# Patient Record
Sex: Female | Born: 1985 | Hispanic: Yes
Health system: Midwestern US, Community
[De-identification: ages and names within clinical notes are randomized; demographics above are authoritative.]

## PROBLEM LIST (undated history)

## (undated) DIAGNOSIS — I10 Essential (primary) hypertension: Secondary | ICD-10-CM

---

## 2019-09-06 IMAGING — CR C-SPINE 4 or 5 views
1 series · 5 of 5 positions shown · non-contrast
Comparison: None.

HISTORY: 31-year-old female with nontoxic goiter, unspecified, localized swelling, mass and lump, neck    .
TECHNIQUE: 5 views of cervical spine.

[Series 1: lat · 0.17mm/px · 5 of 5 slices shown]
[im 1/5]
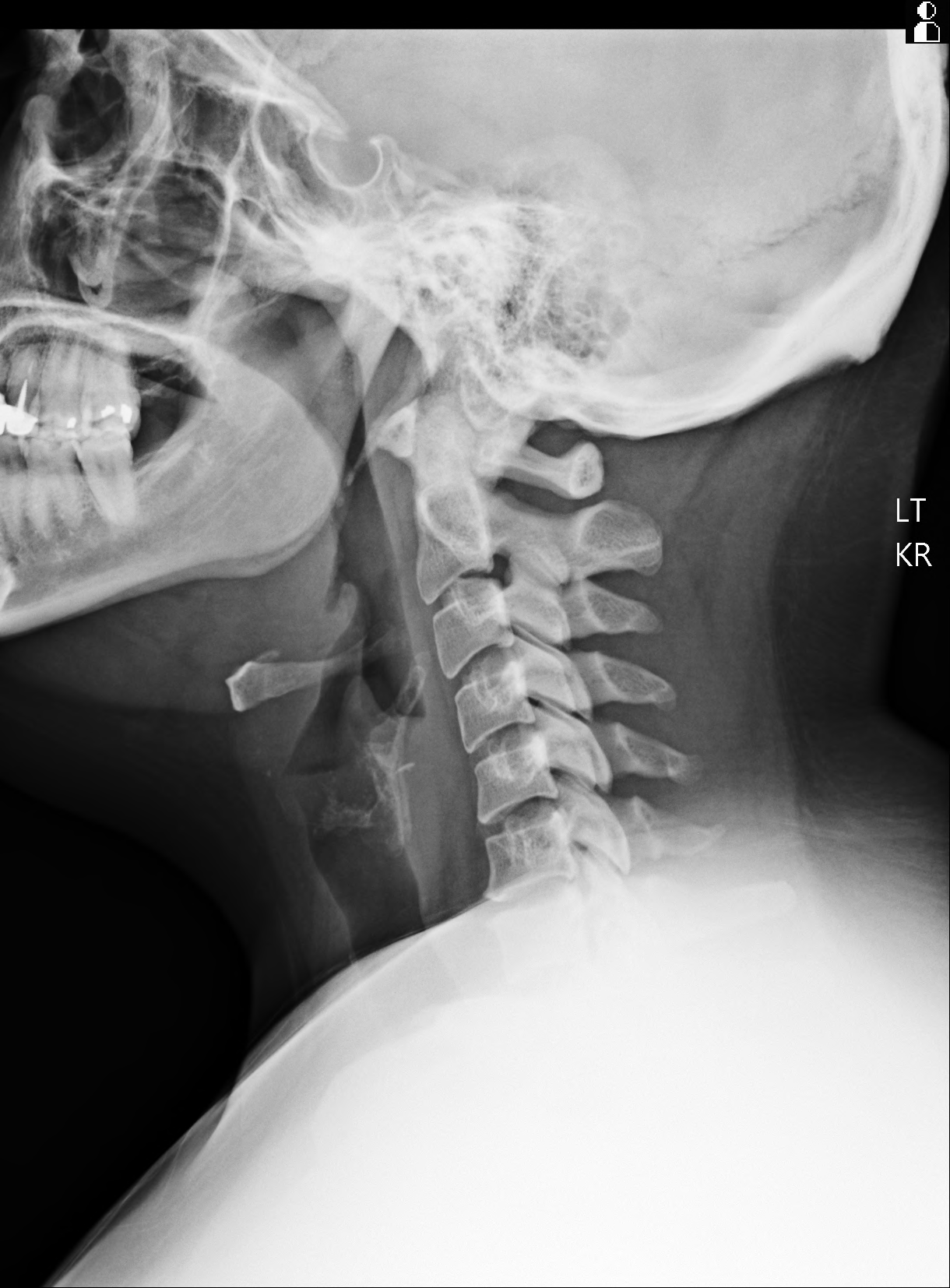
[im 2/5]
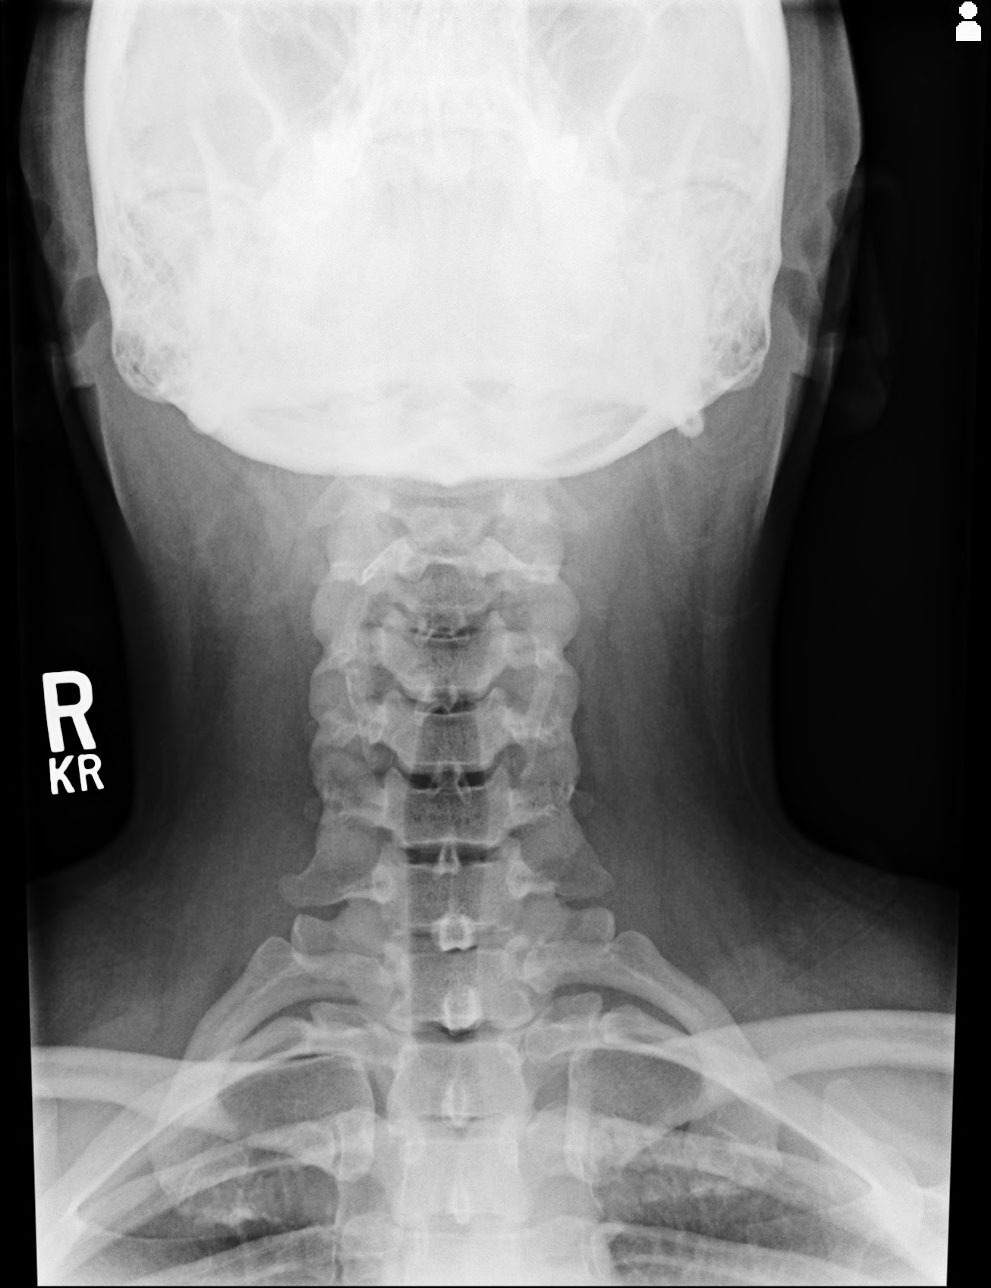
[im 3/5]
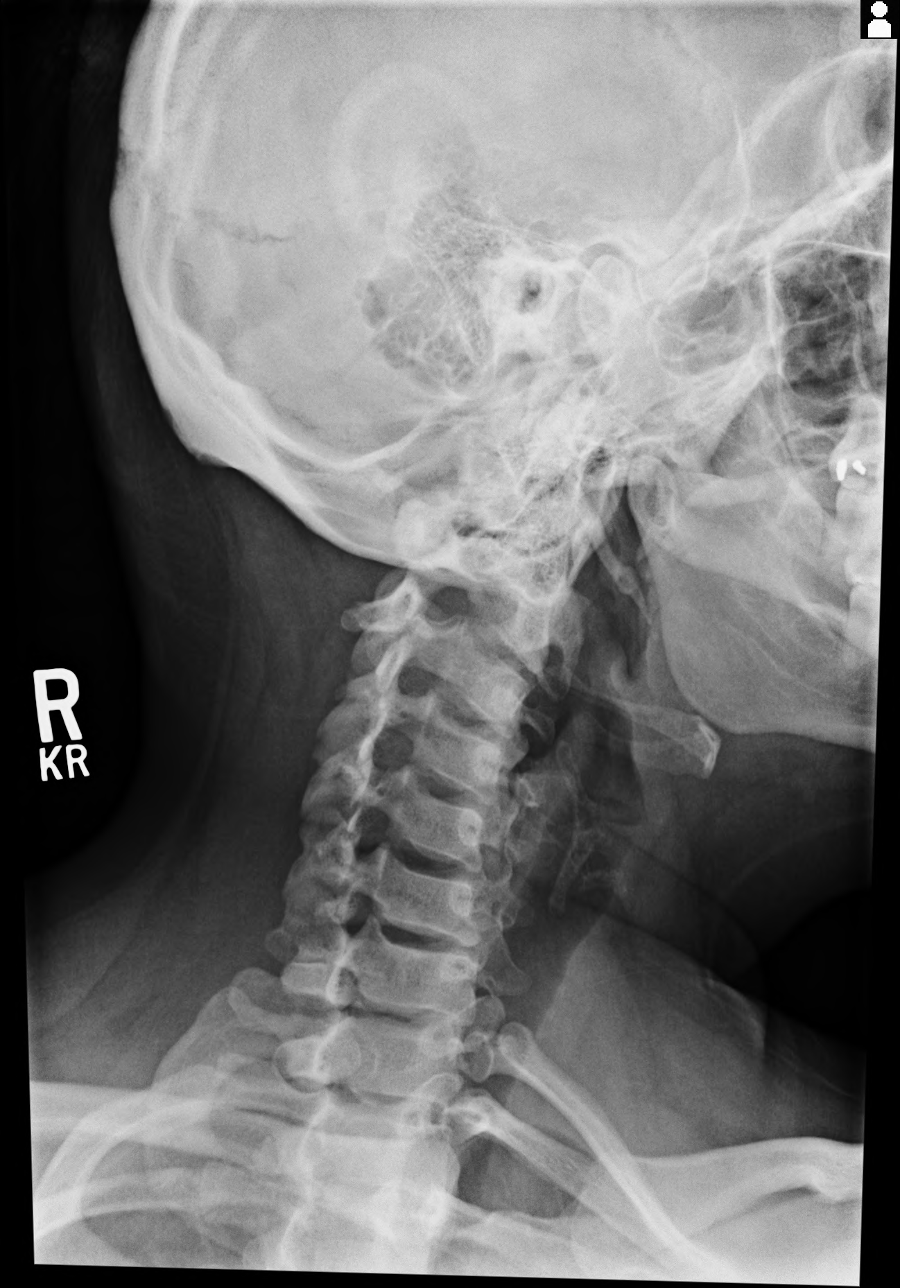
[im 4/5]
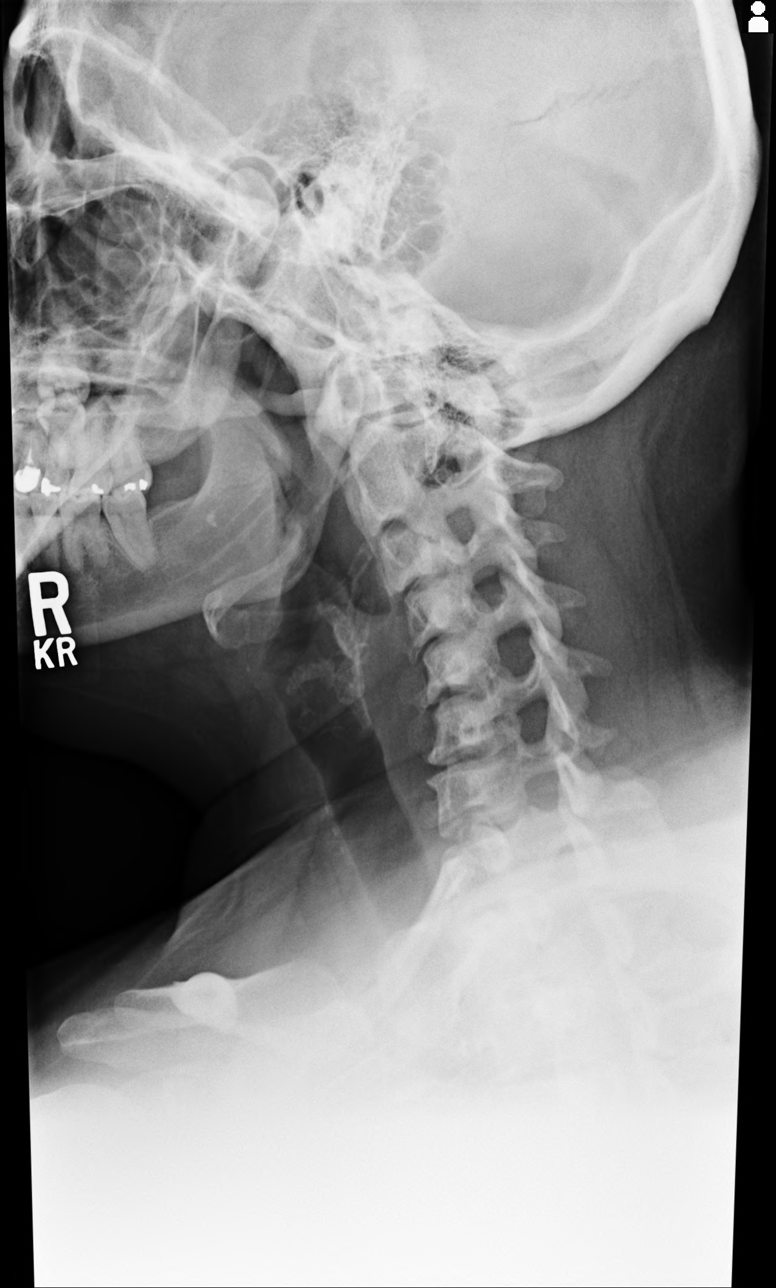
[im 5/5]
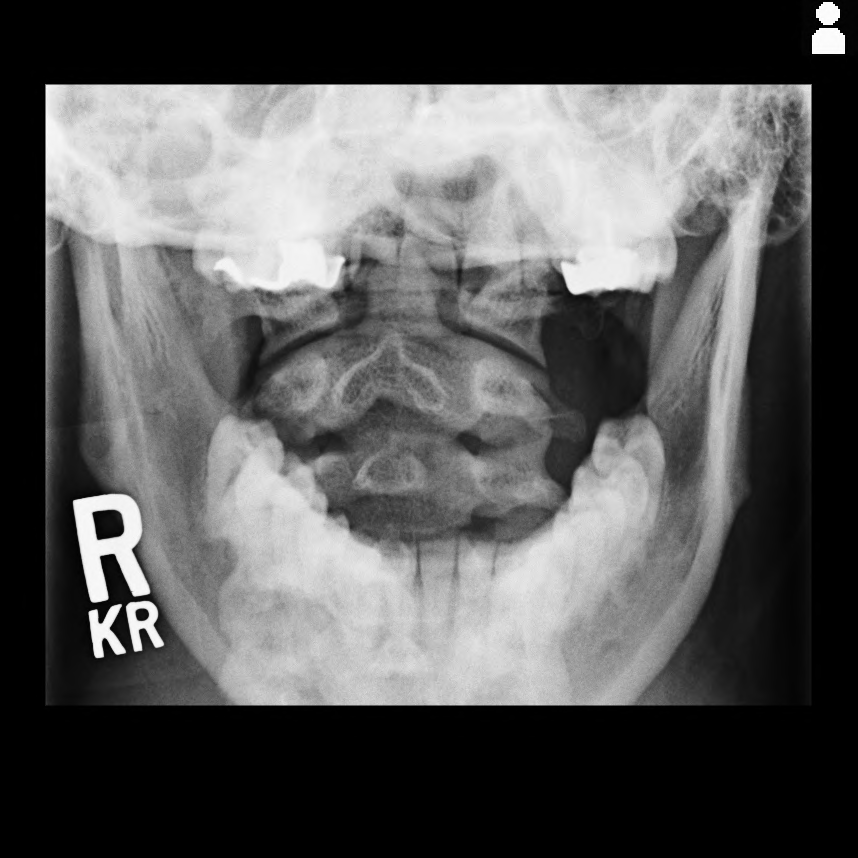

[5 of 5 positions shown; findings below may reference images not displayed]

FINDINGS: Vertebral body height and alignment: The vertebral body height and alignment are unremarkable.

Degenerative disc changes: No significant degenerative changes are present.

Soft tissue: The soft tissue is unremarkable.
IMPRESSION: The vertebral body height and alignment are within normal limits.

## 2019-09-11 IMAGING — US US SOFT TISSUE NECK/THYROID
1 series · 14 of 25 positions shown · non-contrast
Comparison: None.

HISTORY: 31-year-old female, swelling, mass and lump.
TECHNIQUE: Thyroid/neck ultrasound.

[Series 1: us soft tissue neck/thyroid · 14 of 42 slices shown]
[im 1/42]
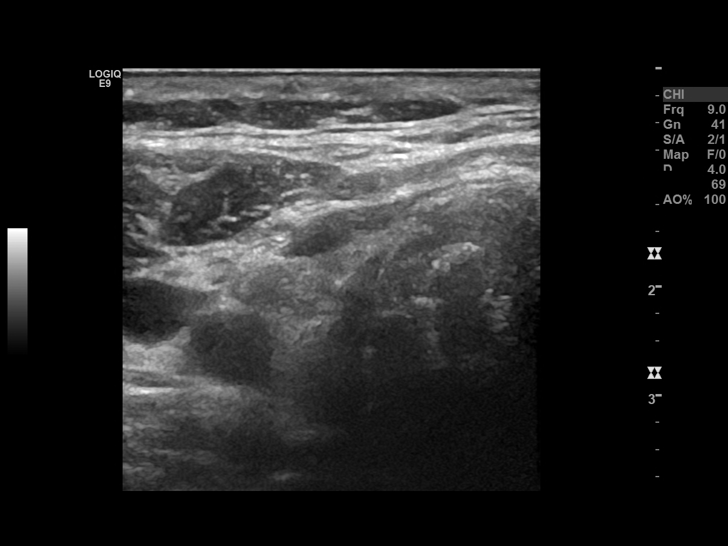
[im 4/42]
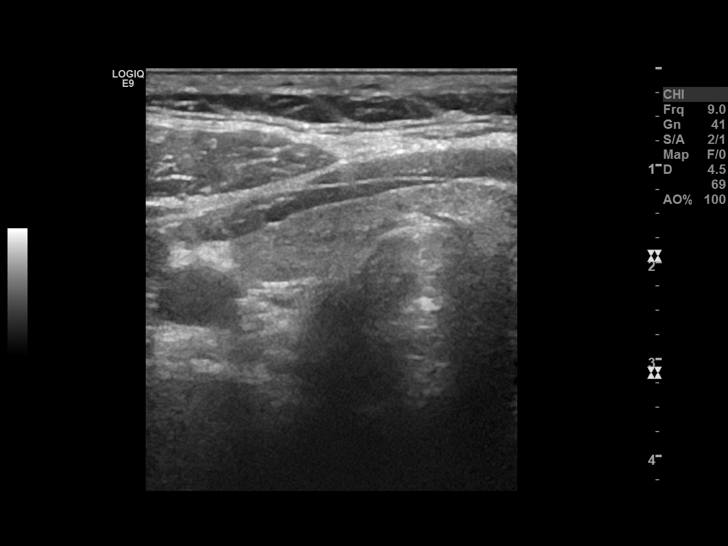
[im 7/42]
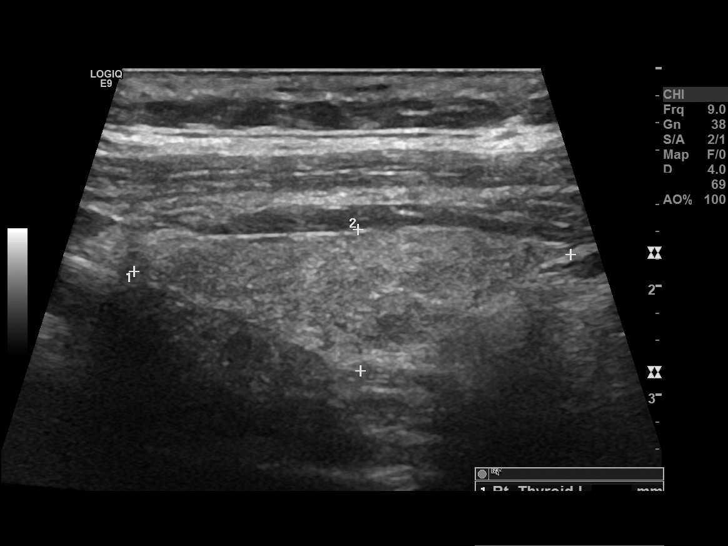
[im 11/42]
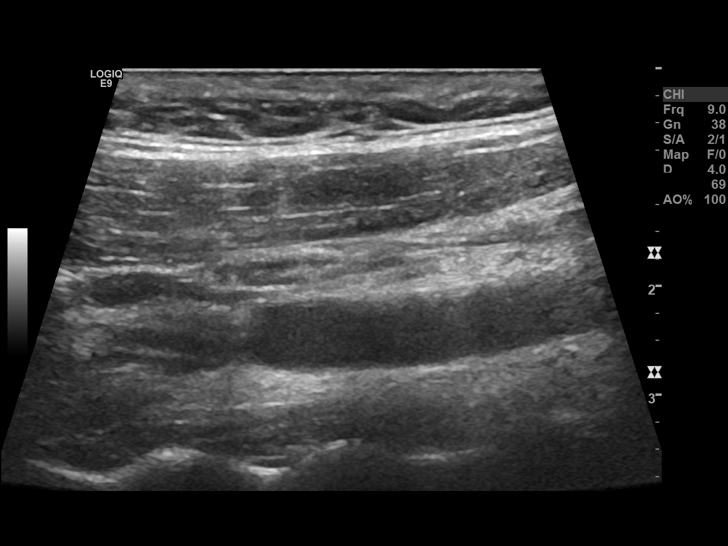
[im 14/42]
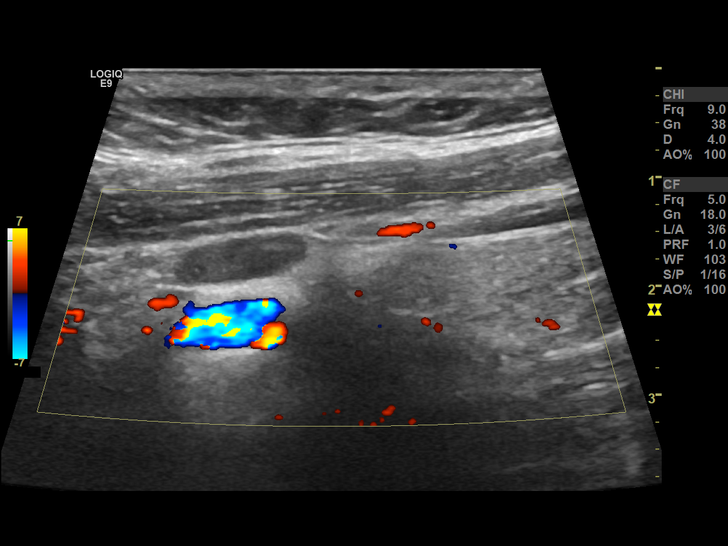
[im 16/42]
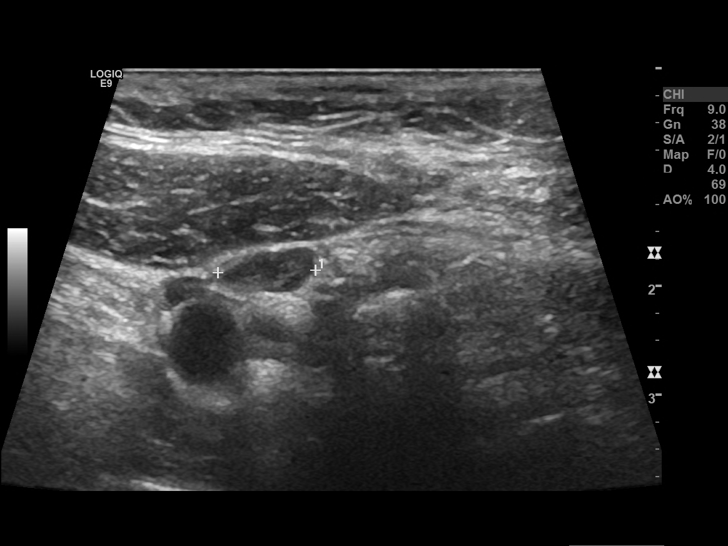
[im 19/42]
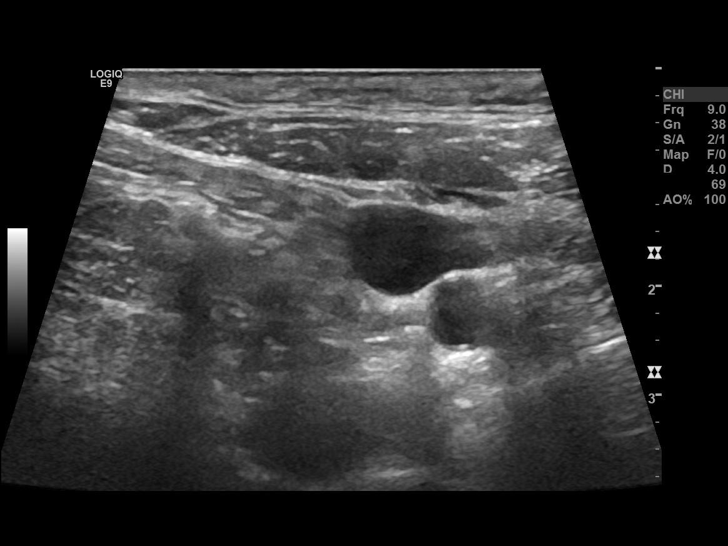
[im 23/42]
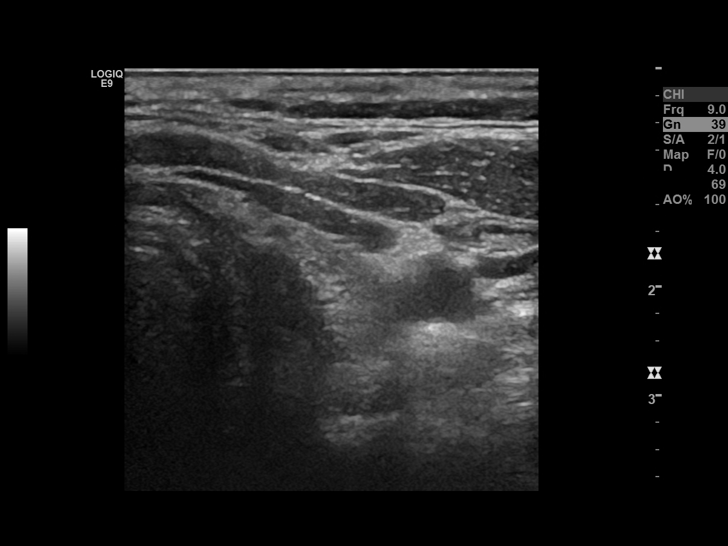
[im 26/42]
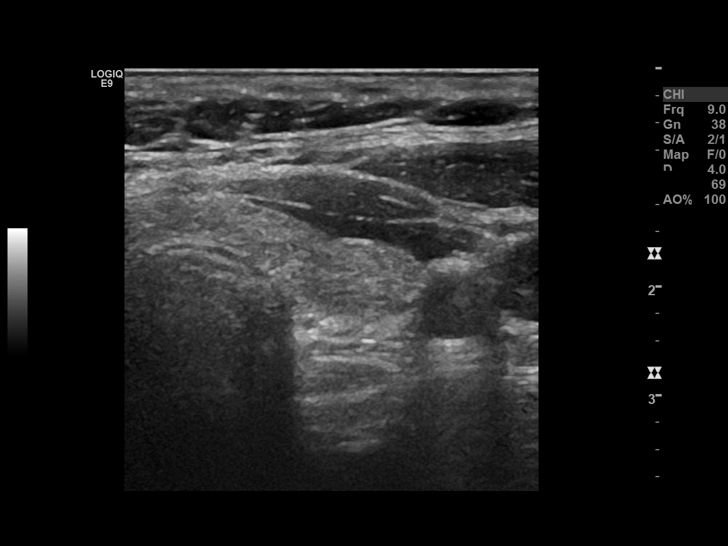
[im 28/42]
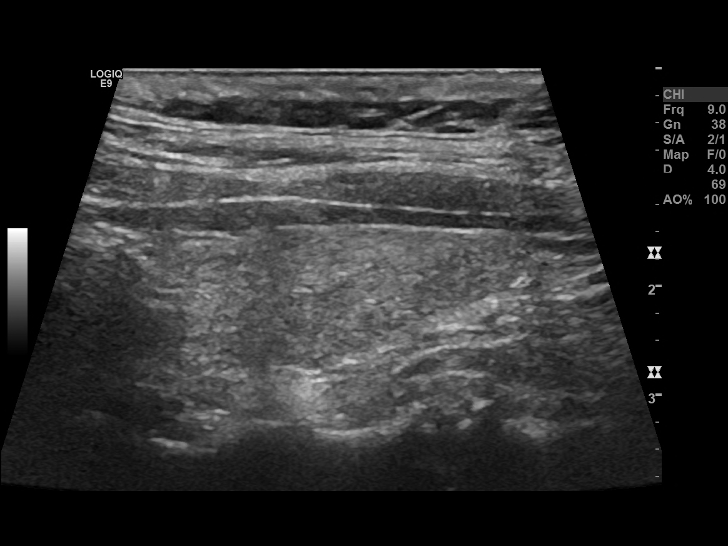
[im 31/42]
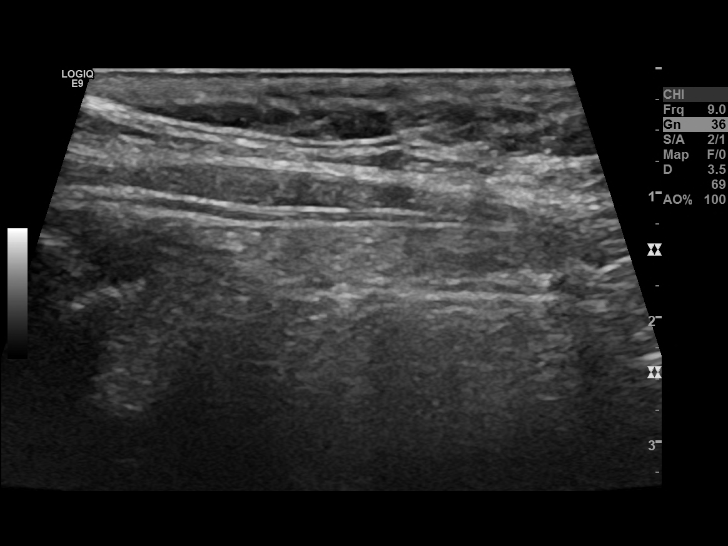
[im 35/42]
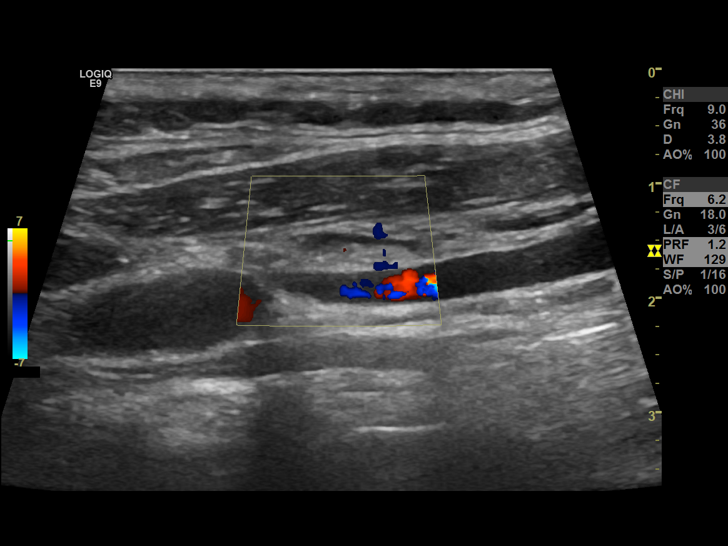
[im 38/42]
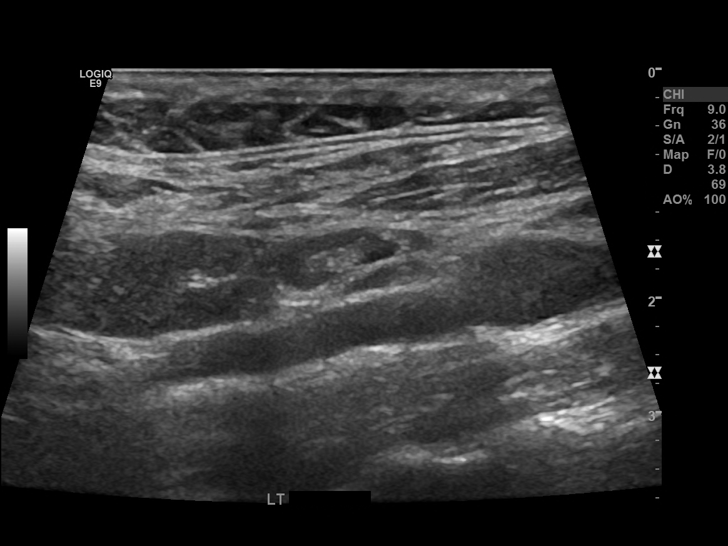
[im 42/42]
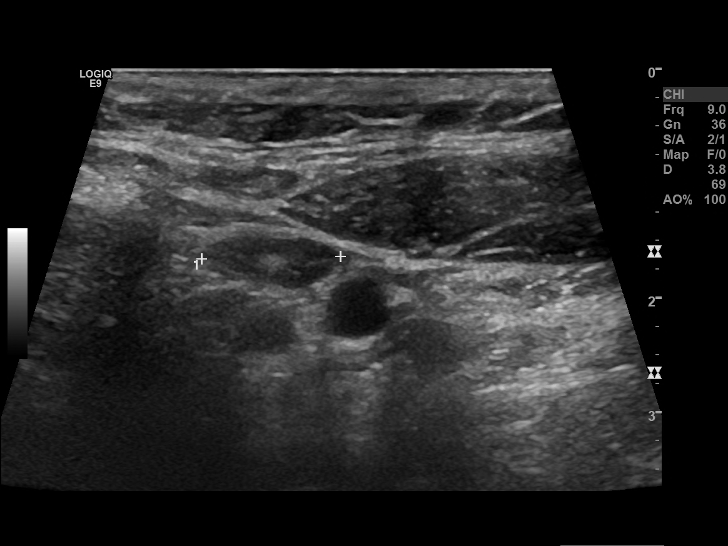

[14 of 25 positions shown; findings below may reference images not displayed]

FINDINGS: Thyroid size:

Right lobe: 40 x 13 x 14 mm.

Isthmus: 3 mm.

Left lobe: 39 x 14 x 16 mm.

Thyroid nodules:

Right lobe nodules:

1. None.

Left lobe nodules:

1. None.

NECK ULTRASOUND: Zone III on the right shows a lymph node 12 x 5 x 9 mm.

Left neck shows two lymph nodes, one zone III of 10 x 2 x 6 mm and a second in zone VI of 14 x 5 x 12 mm. These lymph nodes have fatty hilus, not increased vasculature and short axis is less than 10 mm; therefore, not pathologically large.
IMPRESSION: 1. Lymph nodes in neck described not pathologically large.

2. No thyroid mass or enlargement.

3. For persistent neck discomfort, pain or findings clinically, recommend followup CT of neck.

## 2019-09-27 IMAGING — CT CT NECK SOFT TISSUE W CONTRAST
3 of 4 series · 14 of 33 positions shown, 17 images · IV contrast (isovue)
Comparison: Ultrasound of neck 02/08/2018.

HISTORY: 31-year-old female with localized swelling, mass or lump in neck.
TECHNIQUE: Multiple helical CT images of the neck were acquired after the administration of IV contrast.  Multiplanar reformatted images were also generated.

CONTRAST: 80 mL Isovue 300 IV contrast.

[Series 3: soft tissue w · axial · 0.48mm/px · z∈[-284,-116]mm · 6 of 106 slices shown, 8 images]
[im 11/106  soft-tissue]
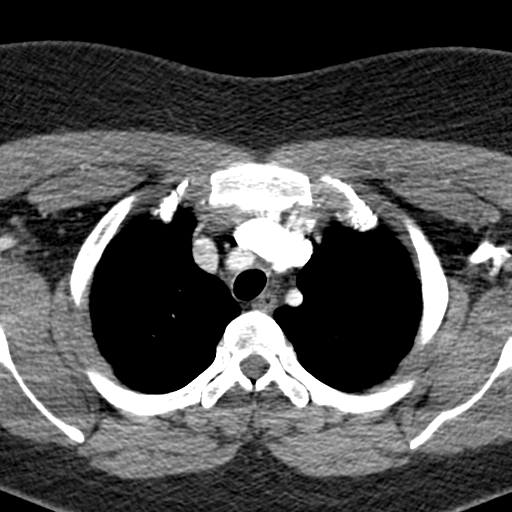
[im 11/106  bone]
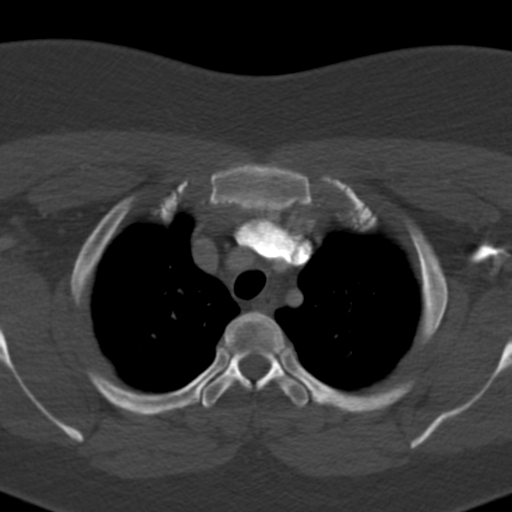
[im 32/106  bone]
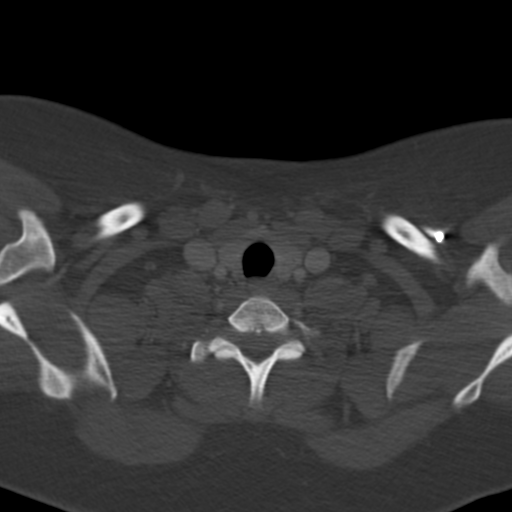
[im 43/106  bone]
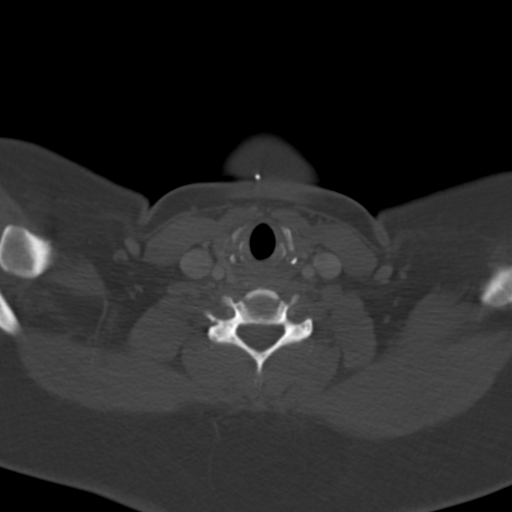
[im 64/106  bone]
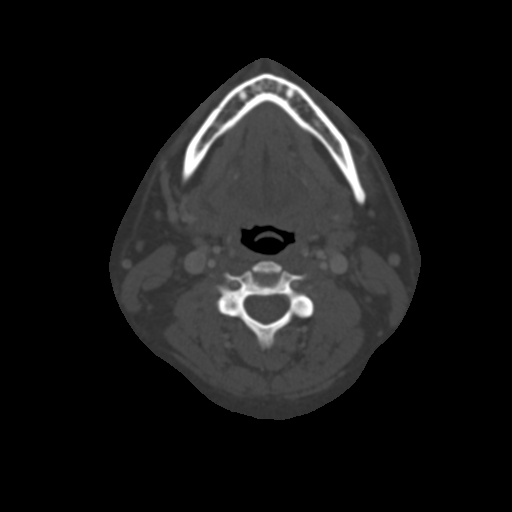
[im 74/106  soft-tissue]
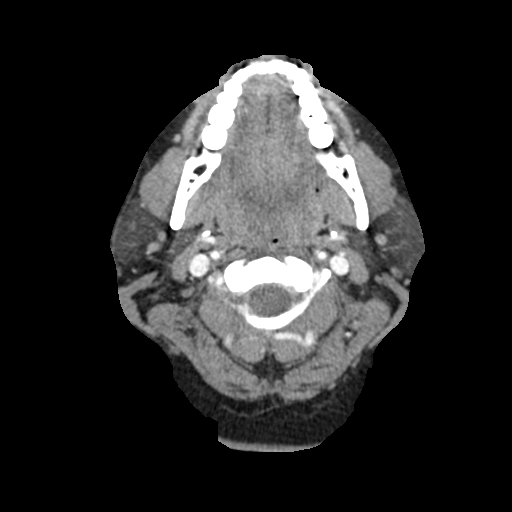
[im 74/106  bone]
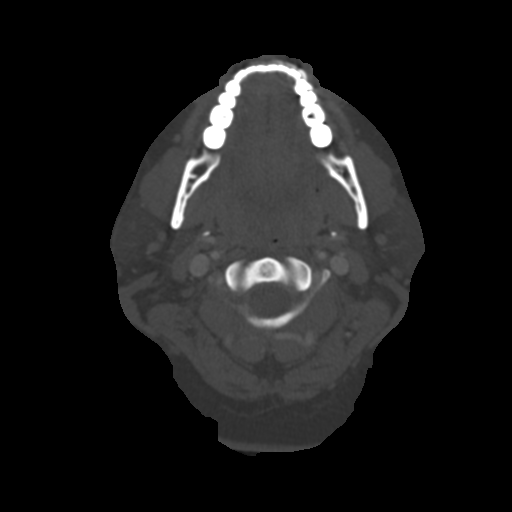
[im 95/106  bone]
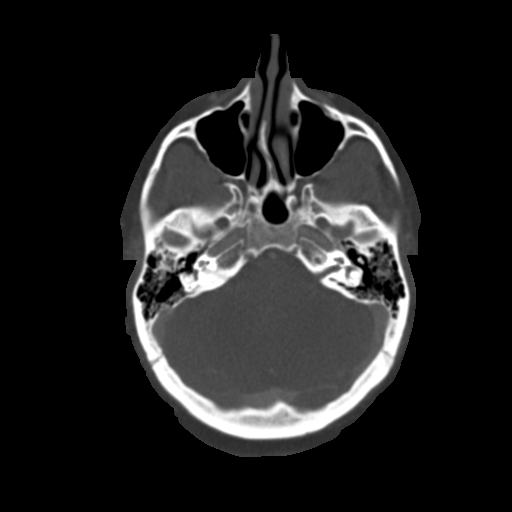

[Series 5: cor · coronal · 0.41mm/px · 3 of 106 slices shown]
[im 22/106  bone]
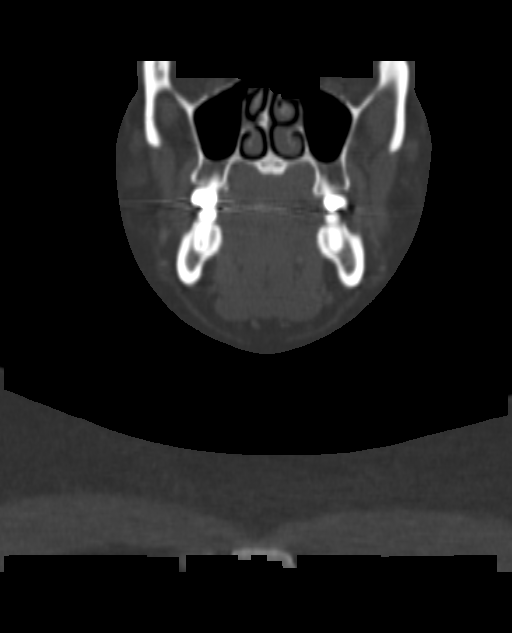
[im 43/106  bone]
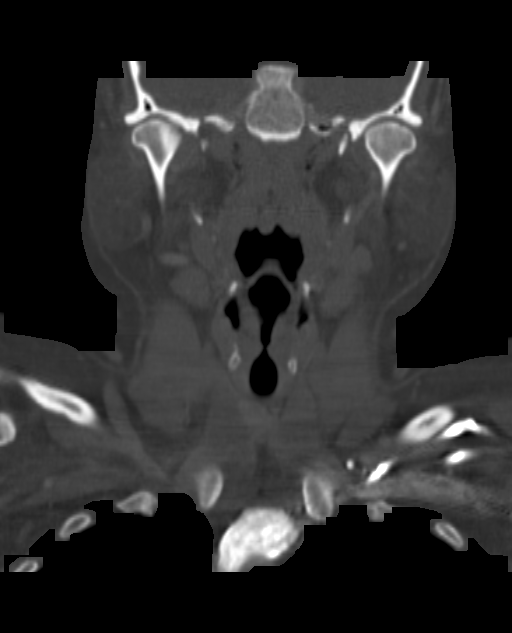
[im 64/106  bone]
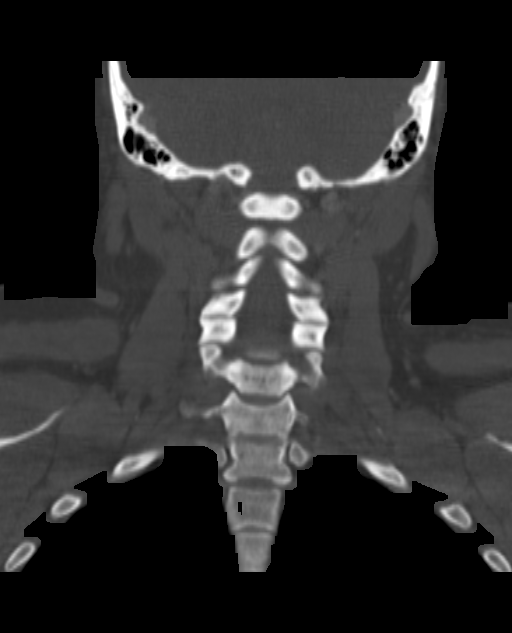

[Series 6: sag · sagittal · 0.41mm/px · 5 of 100 slices shown, 6 images]
[im 34/100  bone]
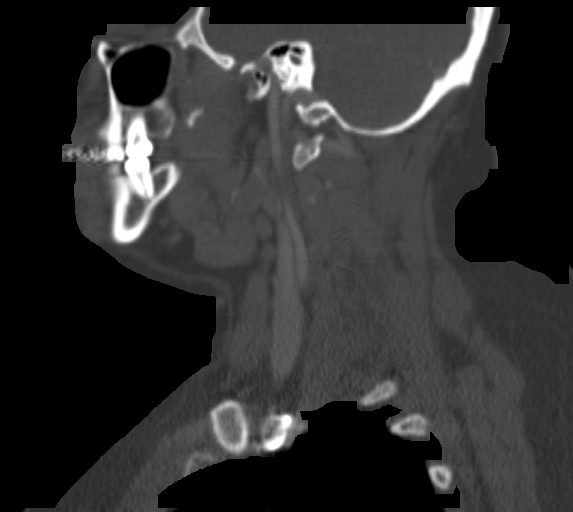
[im 42/100  bone]
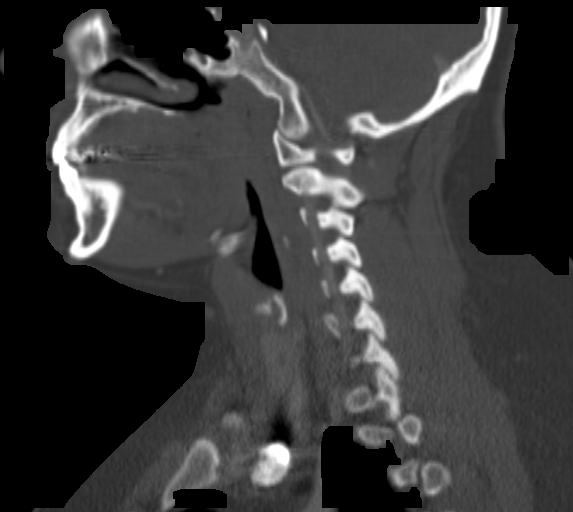
[im 50/100  soft-tissue]
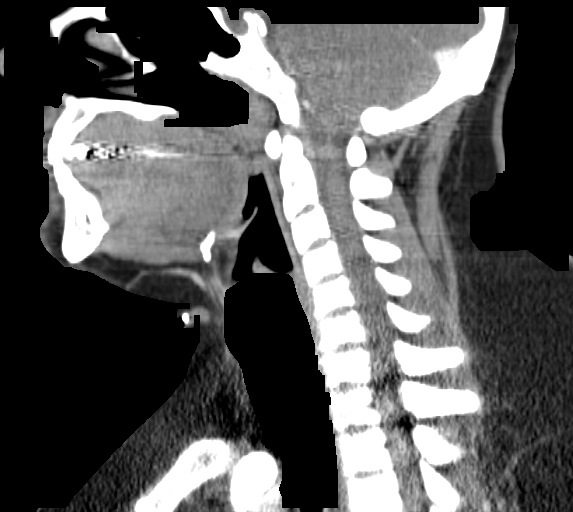
[im 50/100  bone]
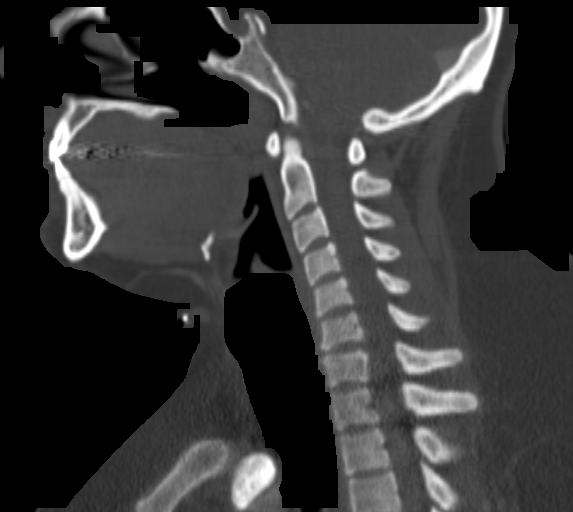
[im 58/100  bone]
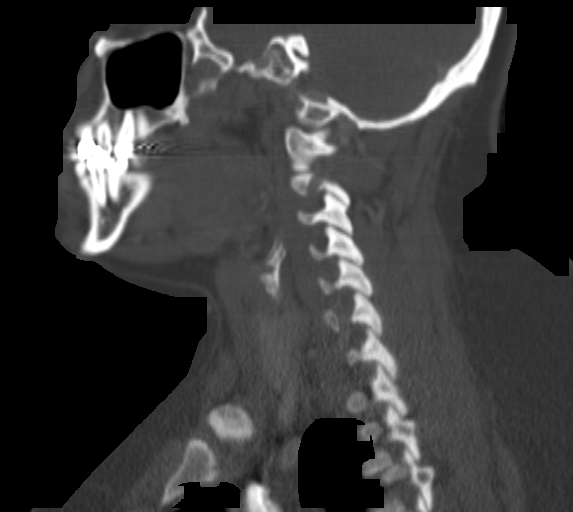
[im 67/100  bone]
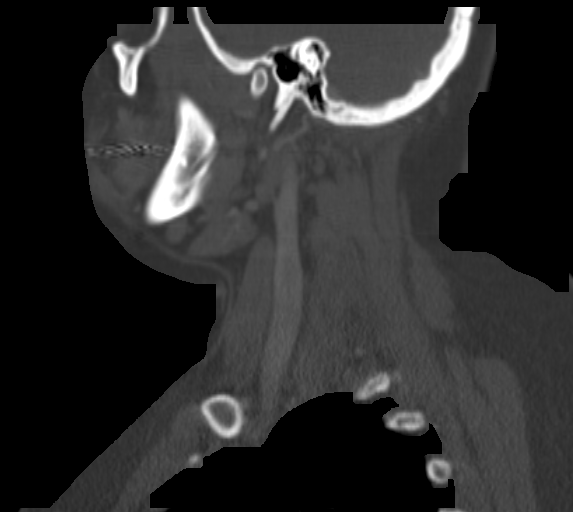

[14 of 33 positions shown; findings below may reference images not displayed]

FINDINGS: There is a skin marker in the anterior midline neck, indicating palpable lump. Deep to the skin marker, there is an unremarkable superficial vein, with normal opacification. Otherwise, no suspicious solid or cystic mass lesion at this level. Specifically, no abnormal adenopathy or lipoma. A second skin marker is noted in the posterior right neck, indicating palpable lump. There is a small 1.0 x 0.5 cm benign-appearing lymph node adjacent to the skin marker, with benign fatty hilum, likely benign. Additional subcentimeter lymph nodes noted throughout all cervical nodal stations, all benign appearing and likely reactive.

The bilateral parotid and submandibular salivary glands are within normal limits and symmetric. The parapharyngeal tissues show no abnormal enhancement or asymmetry. Floor of mouth is also symmetric. Pharyngeal and hypopharyngeal structures unremarkable. Partially visualized brain, orbits, and lung apices are grossly unremarkable. Bones are within normal limits. Partially visualized paranasal sinuses are clear, without air-fluid level. Mastoid air cells and middle ear cavities are well aerated. Vasculature grossly unremarkable.
IMPRESSION: 1. Palpable lump in the anterior midline neck has no suspicious CT correlate. The palpable lump may correspond to an unremarkable superficial vein in this region.

2. Palpable lump in the right posterior neck corresponds to a benign appearing 1.0 x 0.5 cm level 5 lymph node. This also appears benign and reactive, similar in size and appearance to the numerous subcentimeter lymph nodes noted throughout all cervical nodal stations.

 Total radiation dose to patient is CTDIvol 10.24 mGy and DLP 250.84 mGy-cm.

## 2022-11-29 ENCOUNTER — Ambulatory Visit: Admit: 2022-11-29 | Discharge: 2022-11-29 | Payer: TRICARE (CHAMPUS)

## 2022-11-29 DIAGNOSIS — I10 Essential (primary) hypertension: Secondary | ICD-10-CM

## 2022-11-29 MED ORDER — LOSARTAN POTASSIUM 25 MG PO TABS
25 MG | ORAL_TABLET | Freq: Every day | ORAL | 1 refills | Status: AC
Start: 2022-11-29 — End: 2023-01-19

## 2022-11-29 NOTE — Progress Notes (Signed)
Misty Cowan presents today for   Chief Complaint   Patient presents with    Headache     X 3 days  says Excedrin is helping some. But comes right back.        Is someone accompanying this pt? No     Is the patient using any DME equipment during OV? No     Depression Screening:      11/29/2022     2:04 PM   PHQ-9 Questionaire   Little interest or pleasure in doing things 0   Feeling down, depressed, or hopeless 0   PHQ-9 Total Score 0       Fall Risk       No data to display                 Health Maintenance reviewed and discussed and ordered per Provider.    Health Maintenance Due   Topic Date Due    Hepatitis B vaccine (1 of 3 - 3-dose series) Never done    COVID-19 Vaccine (1) Never done    Varicella vaccine (1 of 2 - 2-dose childhood series) Never done    Depression Screen  Never done    HIV screen  Never done    Hepatitis C screen  Never done    Cervical cancer screen  Never done    Diabetes screen  Never done   .        "Have you been to the ER, urgent care clinic since your last visit?  Hospitalized since your last visit?"    NO    "Have you seen or consulted any other health care providers outside of [facility] since your last visit?"    NO        "Have you had a pap smear?"    NO    No cervical cancer screening on file

## 2022-11-30 ENCOUNTER — Inpatient Hospital Stay: Admit: 2022-11-30 | Payer: TRICARE (CHAMPUS)

## 2022-11-30 DIAGNOSIS — Z13220 Encounter for screening for lipoid disorders: Secondary | ICD-10-CM

## 2022-11-30 LAB — CBC WITH AUTO DIFFERENTIAL
Basophils %: 1 % (ref 0–2)
Basophils Absolute: 0.1 10*3/uL (ref 0.0–0.1)
Eosinophils %: 3 % (ref 0–5)
Eosinophils Absolute: 0.2 10*3/uL (ref 0.0–0.4)
Hematocrit: 40.4 % (ref 35.0–45.0)
Hemoglobin: 13.7 g/dL (ref 12.0–16.0)
Immature Granulocytes %: 0 % (ref 0–0.5)
Immature Granulocytes Absolute: 0 10*3/uL (ref 0.00–0.04)
Lymphocytes %: 36 % (ref 21–52)
Lymphocytes Absolute: 2.7 10*3/uL (ref 0.9–3.6)
MCH: 31 PG (ref 24.0–34.0)
MCHC: 33.9 g/dL (ref 31.0–37.0)
MCV: 91.4 FL (ref 78.0–100.0)
MPV: 9.7 FL (ref 9.2–11.8)
Monocytes %: 6 % (ref 3–10)
Monocytes Absolute: 0.4 10*3/uL (ref 0.05–1.2)
Neutrophils %: 54 % (ref 40–73)
Neutrophils Absolute: 4 10*3/uL (ref 1.8–8.0)
Nucleated RBCs: 0 PER 100 WBC
Platelets: 407 10*3/uL (ref 135–420)
RBC: 4.42 M/uL (ref 4.20–5.30)
RDW: 12.6 % (ref 11.6–14.5)
WBC: 7.4 10*3/uL (ref 4.6–13.2)
nRBC: 0 10*3/uL (ref 0.00–0.01)

## 2022-11-30 LAB — HEMOGLOBIN A1C
Estimated Avg Glucose: 108 mg/dL
Hemoglobin A1C: 5.4 % (ref 4.2–5.6)

## 2022-11-30 LAB — COMPREHENSIVE METABOLIC PANEL
ALT: 56 U/L (ref 13–56)
AST: 24 U/L (ref 10–38)
Albumin/Globulin Ratio: 0.9 (ref 0.8–1.7)
Albumin: 3.6 g/dL (ref 3.4–5.0)
Alk Phosphatase: 63 U/L (ref 45–117)
Anion Gap: 7 mmol/L (ref 3.0–18.0)
BUN/Creatinine Ratio: 14 (ref 12–20)
BUN: 10 mg/dL (ref 7–18)
CO2: 29 mmol/L (ref 21–32)
Calcium: 9.2 mg/dL (ref 8.5–10.1)
Chloride: 105 mmol/L (ref 100–111)
Creatinine: 0.73 mg/dL (ref 0.60–1.30)
Est, Glom Filt Rate: >90 mL/min/{1.73_m2} (ref >60)
Globulin: 3.8 g/dL (ref 2.0–4.0)
Glucose: 98 mg/dL (ref 74–99)
Potassium: 3.4 mmol/L — ABNORMAL LOW (ref 3.5–5.5)
Sodium: 141 mmol/L (ref 136–145)
Total Bilirubin: 0.5 mg/dL (ref 0.2–1.0)
Total Protein: 7.4 g/dL (ref 6.4–8.2)

## 2022-11-30 LAB — LIPID PANEL
Chol/HDL Ratio: 3.6 (ref 0–5.0)
Cholesterol, Total: 218 mg/dL — ABNORMAL HIGH (ref <200)
HDL: 60 mg/dL (ref 40–60)
LDL Cholesterol: 121.2 mg/dL — ABNORMAL HIGH (ref 0–100)
Triglycerides: 184 mg/dL — ABNORMAL HIGH (ref <150)
VLDL Cholesterol Calculated: 36.8 mg/dL

## 2022-11-30 LAB — VITAMIN B12: Vitamin B-12: 387 pg/mL (ref 211–911)

## 2022-11-30 LAB — TSH: TSH, 3rd Generation: 2.13 u[IU]/mL (ref 0.36–3.74)

## 2022-11-30 LAB — VITAMIN D 25 HYDROXY: Vit D, 25-Hydroxy: 9.5 ng/mL — ABNORMAL LOW (ref 30–100)

## 2022-12-03 ENCOUNTER — Ambulatory Visit: Admit: 2022-12-03 | Discharge: 2022-12-03 | Payer: TRICARE (CHAMPUS)

## 2022-12-03 DIAGNOSIS — E559 Vitamin D deficiency, unspecified: Secondary | ICD-10-CM

## 2022-12-03 MED ORDER — VITAMIN D (ERGOCALCIFEROL) 1.25 MG (50000 UT) PO CAPS
1.25 | ORAL_CAPSULE | ORAL | 1 refills | Status: AC
Start: 2022-12-03 — End: ?

## 2022-12-03 NOTE — Progress Notes (Signed)
Laili Manary presents today for   Chief Complaint   Patient presents with    Dizziness    Follow-up     Blood pressure        Is someone accompanying this pt? no    Is the patient using any DME equipment during OV? no    Depression Screening:      11/29/2022     2:04 PM   PHQ-9 Questionaire   Little interest or pleasure in doing things 0   Feeling down, depressed, or hopeless 0   PHQ-9 Total Score 0       Fall Risk       No data to display                 Health Maintenance reviewed and discussed and ordered per Provider.    Health Maintenance Due   Topic Date Due    Hepatitis B vaccine (1 of 3 - 3-dose series) Never done    COVID-19 Vaccine (1) Never done    Varicella vaccine (1 of 2 - 2-dose childhood series) Never done    HIV screen  Never done    Hepatitis C screen  Never done    Cervical cancer screen  Never done   .        "Have you been to the ER, urgent care clinic since your last visit?  Hospitalized since your last visit?"    NO    "Have you seen or consulted any other health care providers outside of [facility] since your last visit?"    NO        "Have you had a pap smear?"    NO    No cervical cancer screening on file     Bfowler,lpn

## 2022-12-08 NOTE — Progress Notes (Signed)
Misty Cowan is a 37 y.o. female presents with   Chief Complaint   Patient presents with    Dizziness    Follow-up     Blood pressure      Patient presents today for follow-up related to blood pressure.  Her blood pressure is lower today in office and previously however it is not optimal.  She reports her headache has stopped.   Diagnosis   1. Vitamin D deficiency         BP (!) 138/92 (Site: Right Upper Arm, Position: Sitting, Cuff Size: Large Adult)   Pulse 91   Temp 97.8 F (36.6 C) (Temporal)   Ht 1.524 m (5')   Wt 104.8 kg (231 lb)   SpO2 96%   BMI 45.11 kg/m   Subjective:   History reviewed. No pertinent past medical history.  Past Surgical History:   Procedure Laterality Date    CESAREAN SECTION      2008, 2013     Social History     Socioeconomic History    Marital status: Unknown     Spouse name: None    Number of children: None    Years of education: None    Highest education level: None   Tobacco Use    Smoking status: Former     Current packs/day: 0.00     Average packs/day: 0.3 packs/day     Types: Cigarettes     Start date: 11/10/2003     Quit date: 11/12/2003     Years since quitting: 19.0    Smokeless tobacco: Never    Tobacco comments:     Tried as a kid; wasnt for me   Vaping Use    Vaping Use: Never used   Substance and Sexual Activity    Alcohol use: Yes     Comment: 3 drinks a year    Drug use: Never    Sexual activity: Yes     Partners: Male     Comment: Spouse     Social Determinants of Health     Financial Resource Strain: Low Risk  (11/29/2022)    Overall Financial Resource Strain (CARDIA)     Difficulty of Paying Living Expenses: Not hard at all   Food Insecurity: No Food Insecurity (11/29/2022)    Hunger Vital Sign     Worried About Running Out of Food in the Last Year: Never true     Ran Out of Food in the Last Year: Never true   Transportation Needs: Unknown (11/29/2022)    PRAPARE - Transportation     Lack of Transportation (Non-Medical): No   Housing Stability: Unknown (11/29/2022)     Housing Stability Vital Sign     Unstable Housing in the Last Year: No       No Known Allergies  The patient has a family history of    Current Outpatient Medications   Medication Instructions    loratadine (CLARITIN) 10 mg, Oral, PRN    losartan (COZAAR) 25 mg, Oral, DAILY    vitamin D (ERGOCALCIFEROL) 50,000 Units, Oral, WEEKLY         REVIEW OF SYSTEMS  Review of Systems   Constitutional: Negative.    Respiratory: Negative.     Cardiovascular: Negative.    Gastrointestinal: Negative.    Genitourinary: Negative.    Neurological: Negative.           Objective:     PHYSICAL EXAM  Physical Exam  Vitals and nursing note reviewed.  Constitutional:       Appearance: Normal appearance.   Skin:     General: Skin is warm and dry.   Neurological:      Mental Status: She is alert and oriented to person, place, and time.   Psychiatric:         Mood and Affect: Mood normal.         Behavior: Behavior normal.           Assessment/Plan:     1. Vitamin D deficiency  -     vitamin D (ERGOCALCIFEROL) 1.25 MG (50000 UT) CAPS capsule; Take 1 capsule by mouth once a week, Disp-12 capsule, R-1Normal  2. Primary hypertension       - Increase Losartan to 50mg  daily.          Medication List            Accurate as of Dec 03, 2022 11:59 PM. If you have any questions, ask your nurse or doctor.                START taking these medications      vitamin D 1.25 MG (50000 UT) Caps capsule  Commonly known as: ERGOCALCIFEROL  Take 1 capsule by mouth once a week  Started by: Flonnie Hailstone, APRN - NP            CONTINUE taking these medications      Claritin 10 MG tablet  Generic drug: loratadine     losartan 25 MG tablet  Commonly known as: COZAAR  Take 1 tablet by mouth daily               Where to Get Your Medications        These medications were sent to [id number] [place], [place] - [address] [place] [phone] Carmon Ginsberg [phone]  [address] [place], [place] [address]      Phone: [phone]   vitamin D 1.25 MG (50000 UT)  Caps capsule         Flonnie Hailstone, APRN - NP                          Disclaimer:    I have discussed the diagnosis with the patient and the intended plan as seen above.The patient understands our medical plan. The risks, benefits and significant side effects of all medications have been reviewed. Anticipated time course and progression of condition reviewed. All questions have been addressed.  She received an after visit summary, with information reviewed, and questions answered.      Where appropriate, she is instructed to call the clinic if she has not been notified either by phone or through MyChart with the results of her tests or with an appointment plan for any referrals within 1 week(s). The patient  is to call if her condition worsens or fails to improve or if significant side effects are experienced.       Flonnie Hailstone, APRN - NP

## 2022-12-14 ENCOUNTER — Telehealth: Admit: 2022-12-14 | Discharge: 2022-12-14 | Payer: TRICARE (CHAMPUS)

## 2022-12-14 DIAGNOSIS — I10 Essential (primary) hypertension: Secondary | ICD-10-CM

## 2022-12-14 NOTE — Progress Notes (Signed)
Tyquesha Mcconathy presents today for   Chief Complaint   Patient presents with    Follow-up     Started patient on losartan 25 mg at last visit.          Depression Screening:      11/29/2022     2:04 PM   PHQ-9 Questionaire   Little interest or pleasure in doing things 0   Feeling down, depressed, or hopeless 0   PHQ-9 Total Score 0       Fall Risk       No data to display                  Health Maintenance reviewed and discussed and ordered per Provider.    Health Maintenance Due   Topic Date Due    Hepatitis B vaccine (1 of 3 - 3-dose series) Never done    COVID-19 Vaccine (1) Never done    Varicella vaccine (1 of 2 - 2-dose childhood series) Never done    HIV screen  Never done    Hepatitis C screen  Never done    Cervical cancer screen  Never done   .      "Have you been to the ER, urgent care clinic since your last visit?  Hospitalized since your last visit?"    NO    "Have you seen or consulted any other health care providers outside of [facility] since your last visit?"    NO

## 2022-12-14 NOTE — Progress Notes (Signed)
She and/or health care decision maker is aware that that she may receive a bill for this telephone service, depending on her insurance coverage, and has provided verbal consent to proceed.     This is a Patient Initiated Episode with an Established Patient who has not had a related appointment within my department in the past 7 days or scheduled within the next 24 hours.           HISTORY OF PRESENTING ILLNESS   Misty Cowan is a 37 y.o. female evaluated via telephone on 12/14/2022  Patient unable to participate in Virtual Visit with synchronous Restaurant manager, fast food.            Diagnosis   1. Primary hypertension    Patient presents today for follow-up related to hypertension.  She reports her blood pressure at home has been running in the upper 130s over 80s/90s.  She reports she is still having some headaches.  Discussed with her increasing losartan to 50 mg daily continue to check her blood pressure twice a day and follow-up in 2 weeks and bring readings to appointment.              PCP Provider  Flonnie Hailstone, APRN - NP  History reviewed. No pertinent past medical history.  Past Surgical History:   Procedure Laterality Date    CESAREAN SECTION      2008, 2013     No Known Allergies  Family History   Problem Relation Age of Onset    Cancer Mother         Passed away at 54 colon CA    Arthritis Mother     Colon Cancer Mother     High Cholesterol Mother     Rheum Arthritis Mother     Hypertension Father     Heart Attack Father     Substance Abuse Father     Diabetes Maternal Grandmother     Kidney Disease Maternal Grandmother     Obesity Maternal Grandmother     Cancer Paternal Grandmother         Passed from throat CA    Asthma Brother     Cancer Paternal Aunt         Passed at 29 leukemia    Cancer Paternal Aunt         Breast CA    Gout Maternal Uncle     Heart Attack Paternal Aunt     Substance Abuse Brother     Substance Abuse Paternal Aunt     Substance Abuse Paternal Uncle         There were no vitals  filed for this visit.  Social History     Socioeconomic History    Marital status: Unknown     Spouse name: Not on file    Number of children: Not on file    Years of education: Not on file    Highest education level: Not on file   Occupational History    Not on file   Tobacco Use    Smoking status: Former     Current packs/day: 0.00     Average packs/day: 0.3 packs/day     Types: Cigarettes     Start date: 11/10/2003     Quit date: 11/12/2003     Years since quitting: 19.1    Smokeless tobacco: Never    Tobacco comments:     Tried as a kid; wasnt for me   Vaping Use  Vaping Use: Never used   Substance and Sexual Activity    Alcohol use: Yes     Comment: 3 drinks a year    Drug use: Never    Sexual activity: Yes     Partners: Male     Comment: Spouse   Other Topics Concern    Not on file   Social History Narrative    Not on file     Social Determinants of Health     Financial Resource Strain: Low Risk  (11/29/2022)    Overall Financial Resource Strain (CARDIA)     Difficulty of Paying Living Expenses: Not hard at all   Food Insecurity: No Food Insecurity (11/29/2022)    Hunger Vital Sign     Worried About Running Out of Food in the Last Year: Never true     Ran Out of Food in the Last Year: Never true   Transportation Needs: Unknown (11/29/2022)    PRAPARE - Therapist, art (Medical): Not on file     Lack of Transportation (Non-Medical): No   Physical Activity: Not on file   Stress: Not on file   Social Connections: Not on file   Intimate Partner Violence: Not on file   Housing Stability: Unknown (11/29/2022)    Housing Stability Vital Sign     Unable to Pay for Housing in the Last Year: Not on file     Number of Places Lived in the Last Year: Not on file     Unstable Housing in the Last Year: No        MEDICATIONS      Current Outpatient Medications   Medication Sig    fluticasone (FLONASE) 50 MCG/ACT nasal spray 2 sprays by Each Nostril route daily    vitamin D (ERGOCALCIFEROL) 1.25 MG (50000  UT) CAPS capsule Take 1 capsule by mouth once a week    losartan (COZAAR) 25 MG tablet Take 1 tablet by mouth daily    loratadine (CLARITIN) 10 MG tablet Take 1 tablet by mouth as needed (Patient not taking: Reported on 12/14/2022)     No current facility-administered medications for this visit.        I have reviewed the nurses notes, vitals, problem list, allergy list, medical history, family, social history and medications.        REVIEW OF SYMPTOMS      General:  Pt is able to conduct ADL's  Respiratory: Denies cough, congestion, shortness of breath  Cardiovascular: Denies chest pain, palpitations, edema   Psychiatric: Denies confusion, insomnia, depression        PHYSICAL EXAM         Due to this being a telephone encounter a very limited exam was performed  Neurological: A&Ox3, no slurred speech, answering questions appropriately  Respiratory: Non labored, talking in complete sentences, no audible wheeze over the phone  Psychiatric: pleasant appropriate, oriented to person place time and situation     ASSESSMENT      Misty Cowan was seen today for follow-up.    Diagnoses and all orders for this visit:    Primary hypertension       Diagnosis   1. Primary hypertension    We will increase patient's losartan to 50 mg daily and she will return in 2 weeks.  I have asked her to check her blood pressure twice a day for the next 2 weeks and bring readings to her next appointment.     No orders  of the defined types were placed in this encounter.       PLAN               Documentation:  I communicated with the patient and/or health care decision maker about hypertension and increasing her losartan to 50mg .     Total Time: minutes: 11-20 minutes    Trenia Macdonell was evaluated through a synchronous (real-time) audio encounter. Patient identification was verified at the start of the visit. She (or guardian if applicable) is aware that this is a billable service, which includes applicable co-pays. This visit was conducted with  the patient's (and/or legal guardian's) verbal consent. She has not had a related appointment within my department in the past 7 days or scheduled within the next 24 hours.   The patient was located at Home: [id number] [facility]  [place] [phone].  The provider was located at [facility] (Appt Dept): [address]  [place],  [place] [address].    Note: not billable if this call serves to triage the patient into an appointment for the relevant concern  Yes, I confirm.   Misty Cowan is a 37 y.o. female evaluated via telephone on 12/14/2022 for Follow-up (Started patient on losartan 25 mg at last visit. Patient stated she is doing well on the medication. She said she still getting headaches some. Patient stated its mostly tension headaches. )  .        Flonnie Hailstone, APRN - NP            I  certify that I am a certified nurse practitioner and licensed in the state of [place]        We discussed the expected course, resolution and complications of the diagnosis(es) in detail.  Medication risks, benefits, costs, interactions, and alternatives were discussed as indicated.  I advised her to contact the office if her condition worsens, changes or fails to improve as anticipated. She expressed understanding with the diagnosis(es) and plan

## 2022-12-21 ENCOUNTER — Encounter

## 2022-12-21 NOTE — Progress Notes (Signed)
Misty Cowan is a 37 y.o. female presents with   Chief Complaint   Patient presents with    Headache     X 3 days  says Excedrin is helping some. But comes right back.      Patient presents today to establish care, she reports she has had a headache for 3 days now and her blood pressure is elevated. She is a [employer] fund[employer] and has been checking her blood pressure and reports it has been elevated.    Diagnosis   1. Primary hypertension       2. Wellness examination       3. Lipid screening       4. Encounter for vitamin deficiency screening          5. Diabetes mellitus screening         BP (!) 174/123   Pulse 74   Temp 97.7 F (36.5 C) (Temporal)   Ht 1.524 m (5')   Wt 105.9 kg (233 lb 6.4 oz)   SpO2 97%   BMI 45.58 kg/m   Subjective:   History reviewed. No pertinent past medical history.  Past Surgical History:   Procedure Laterality Date    CESAREAN SECTION      2008, 2013     Social History     Socioeconomic History    Marital status: Unknown     Spouse name: None    Number of children: None    Years of education: None    Highest education level: None   Tobacco Use    Smoking status: Former     Current packs/day: 0.00     Average packs/day: 0.3 packs/day     Types: Cigarettes     Start date: 11/10/2003     Quit date: 11/12/2003     Years since quitting: 19.1    Smokeless tobacco: Never    Tobacco comments:     Tried as a kid; wasnt for me   Vaping Use    Vaping Use: Never used   Substance and Sexual Activity    Alcohol use: Yes     Comment: 3 drinks a year    Drug use: Never    Sexual activity: Yes     Partners: Male     Comment: Spouse     Social Determinants of Health     Financial Resource Strain: Low Risk  (11/29/2022)    Overall Financial Resource Strain (CARDIA)     Difficulty of Paying Living Expenses: Not hard at all   Food Insecurity: No Food Insecurity (11/29/2022)    Hunger Vital Sign     Worried About Running Out of Food in the Last Year: Never true     Ran Out of Food in the Last Year: Never true    Transportation Needs: Unknown (11/29/2022)    PRAPARE - Transportation     Lack of Transportation (Non-Medical): No   Housing Stability: Unknown (11/29/2022)    Housing Stability Vital Sign     Unstable Housing in the Last Year: No       No Known Allergies  The patient has a family history of    Current Outpatient Medications   Medication Instructions    fluticasone (FLONASE) 50 MCG/ACT nasal spray 2 sprays, Each Nostril, DAILY    loratadine (CLARITIN) 10 mg, PRN    losartan (COZAAR) 25 mg, Oral, DAILY    vitamin D (ERGOCALCIFEROL) 50,000 Units, Oral, WEEKLY         REVIEW  OF SYSTEMS  Review of Systems   Constitutional: Negative.    HENT: Negative.     Eyes: Negative.    Respiratory: Negative.     Cardiovascular: Negative.    Gastrointestinal: Negative.    Endocrine: Negative.    Genitourinary: Negative.    Musculoskeletal: Negative.    Allergic/Immunologic: Negative.    Neurological:  Positive for headaches.   Hematological: Negative.    Psychiatric/Behavioral: Negative.            Objective:     PHYSICAL EXAM  Physical Exam      Assessment/Plan:     1. Primary hypertension  -     losartan (COZAAR) 25 MG tablet; Take 1 tablet by mouth daily, Disp-30 tablet, R-1Normal  -     Comprehensive Metabolic Panel; Future  -     TSH; Future  -     CBC with Auto Differential; Future  2. Wellness examination  3. Lipid screening  -     Lipid Panel; Future  4. Encounter for vitamin deficiency screening  -     Vitamin B12; Future  -     Vitamin D 25 Hydroxy; Future  5. Diabetes mellitus screening  -     Hemoglobin A1C; Future         Medication List            Accurate as of Nov 29, 2022 11:59 PM. If you have any questions, ask your nurse or doctor.                START taking these medications      losartan 25 MG tablet  Commonly known as: COZAAR  Take 1 tablet by mouth daily  Started by: Flonnie Hailstone, APRN - NP               Where to Get Your Medications        [phone] [facility] [place], [place]  - [address] [place] - [place] [phone] Carmon Ginsberg [phone]  [address] [place] [address]      Phone: [phone]   losartan 25 MG tablet         Flonnie Hailstone, APRN - NP                          Disclaimer:    I have discussed the diagnosis with the patient and the intended plan as seen above.The patient understands our medical plan. The risks, benefits and significant side effects of all medications have been reviewed. Anticipated time course and progression of condition reviewed. All questions have been addressed.  She received an after visit summary, with information reviewed, and questions answered.      Where appropriate, she is instructed to call the clinic if she has not been notified either by phone or through MyChart with the results of her tests or with an appointment plan for any referrals within 1 week(s). The patient  is to call if her condition worsens or fails to improve or if significant side effects are experienced.       Flonnie Hailstone, APRN - NP

## 2023-01-19 ENCOUNTER — Ambulatory Visit: Admit: 2023-01-19 | Discharge: 2023-01-19 | Payer: TRICARE (CHAMPUS)

## 2023-01-19 DIAGNOSIS — Z1159 Encounter for screening for other viral diseases: Secondary | ICD-10-CM

## 2023-01-19 MED ORDER — LOSARTAN POTASSIUM 25 MG PO TABS
25 MG | ORAL_TABLET | Freq: Every day | ORAL | 1 refills | Status: AC
Start: 2023-01-19 — End: 2023-08-09

## 2023-01-19 NOTE — Progress Notes (Signed)
Misty Cowan presents today for No chief complaint on file.      Is someone accompanying this pt? no    Is the patient using any DME equipment during OV? no    Depression Screening:      11/29/2022     2:04 PM   PHQ-9 Questionaire   Little interest or pleasure in doing things 0   Feeling down, depressed, or hopeless 0   PHQ-9 Total Score 0       Fall Risk       No data to display                 Health Maintenance reviewed and discussed and ordered per Provider.    Health Maintenance Due   Topic Date Due    Hepatitis B vaccine (1 of 3 - 3-dose series) Never done    COVID-19 Vaccine (1) Never done    Varicella vaccine (1 of 2 - 2-dose childhood series) Never done    HIV screen  Never done    Hepatitis C screen  Never done    Cervical cancer screen  Never done   .        "Have you been to the ER, urgent care clinic since your last visit?  Hospitalized since your last visit?"    NO    "Have you seen or consulted any other health care providers outside of [facility] since your last visit?"    NO        "Have you had a pap smear?"    NO    No cervical cancer screening on file

## 2023-01-19 NOTE — Progress Notes (Signed)
Misty Cowan is a 37 y.o. female presents with   Chief Complaint   Patient presents with    Follow-up     Patient presents today for follow-up related to blood pressure.  Today her blood pressure is well within normal limits.  Her home readings are also within normal limits.  She reports she is feeling much better now that her blood pressure has lowered.  She does reports she feels as if she has gained some weight and she is states she feels like her appetite has decreased.  We discussed the fact that she is probably not eating enough at this point and to prioritize protein and fruits and vegetables versus carbs.  She verbalized understanding.  She also would like referral to GYN for a woman's wellness exam.   Diagnosis   1. Need for hepatitis B screening test       2. Primary hypertension       3. Encounter for screening for HIV       4. Cervical cancer screening         BP 126/89 (Site: Right Upper Arm, Position: Sitting, Cuff Size: Large Adult)   Pulse 75   Temp 97.8 F (36.6 C) (Temporal)   Resp 18   Ht 1.524 m (5')   Wt 106.1 kg (234 lb)   SpO2 98%   BMI 45.70 kg/m   Subjective:   History reviewed. No pertinent past medical history.  Past Surgical History:   Procedure Laterality Date    CESAREAN SECTION      2008, 2013     Social History     Socioeconomic History    Marital status: Unknown     Spouse name: None    Number of children: None    Years of education: None    Highest education level: None   Tobacco Use    Smoking status: Former     Current packs/day: 0.00     Average packs/day: 0.3 packs/day     Types: Cigarettes     Start date: 11/10/2003     Quit date: 11/12/2003     Years since quitting: 19.2    Smokeless tobacco: Never    Tobacco comments:     Tried as a kid; wasnt for me   Vaping Use    Vaping Use: Never used   Substance and Sexual Activity    Alcohol use: Yes     Comment: 3 drinks a year    Drug use: Never    Sexual activity: Yes     Partners: Male     Comment: Spouse     Social  Determinants of Health     Financial Resource Strain: Low Risk  (11/29/2022)    Overall Financial Resource Strain (CARDIA)     Difficulty of Paying Living Expenses: Not hard at all   Food Insecurity: No Food Insecurity (11/29/2022)    Hunger Vital Sign     Worried About Running Out of Food in the Last Year: Never true     Ran Out of Food in the Last Year: Never true   Transportation Needs: Unknown (11/29/2022)    PRAPARE - Transportation     Lack of Transportation (Non-Medical): No   Housing Stability: Unknown (11/29/2022)    Housing Stability Vital Sign     Unstable Housing in the Last Year: No       No Known Allergies  The patient has a family history of    Current Outpatient Medications  Medication Instructions    fluticasone (FLONASE) 50 MCG/ACT nasal spray 2 sprays, Each Nostril, DAILY    losartan (COZAAR) 50 mg, Oral, DAILY    vitamin D (ERGOCALCIFEROL) 50,000 Units, Oral, WEEKLY         REVIEW OF SYSTEMS  Review of Systems   Constitutional: Negative.    Respiratory: Negative.     Cardiovascular: Negative.    Neurological: Negative.           Objective:     PHYSICAL EXAM  Physical Exam  Vitals and nursing note reviewed.   Constitutional:       Appearance: Normal appearance.   Skin:     General: Skin is warm and dry.   Neurological:      Mental Status: She is alert and oriented to person, place, and time.   Psychiatric:         Mood and Affect: Mood normal.         Behavior: Behavior normal.           Assessment/Plan:     1. Need for hepatitis B screening test  -     Hepatitis C Antibody; Future  -     Hepatitis B Surface Antigen; Future  -     Hepatitis B Surface Antibody; Future  2. Primary hypertension  -     losartan (COZAAR) 25 MG tablet; Take 2 tablets by mouth daily, Disp-180 tablet, R-1Normal  3. Encounter for screening for HIV  -     HIV 1/2 Ag/Ab, 4TH Generation,W Rflx Confirm; Future  -     Varicella Zoster Antibody, IgG; Future  4. Cervical cancer screening  -     External Referral To Ob-Gyn          Medication List            Accurate as of January 19, 2023 11:32 AM. If you have any questions, ask your nurse or doctor.                CONTINUE taking these medications      fluticasone 50 MCG/ACT nasal spray  Commonly known as: FLONASE     losartan 25 MG tablet  Commonly known as: COZAAR  Take 2 tablets by mouth daily     vitamin D 1.25 MG (50000 UT) Caps capsule  Commonly known as: ERGOCALCIFEROL  Take 1 capsule by mouth once a week            STOP taking these medications      Claritin 10 MG tablet  Generic drug: loratadine  Stopped by: Flonnie Hailstone, APRN - NP               Where to Get Your Medications        [facility] [phone] [facility] [place], [place] - [address] [place] - [place] [phone] Carmon Ginsberg [phone]  [address] [place] [address]      Phone: [phone]   losartan 25 MG tablet         Flonnie Hailstone, APRN - NP                          Disclaimer:    I have discussed the diagnosis with the patient and the intended plan as seen above.The patient understands our medical plan. The risks, benefits and significant side effects of all medications have been reviewed. Anticipated time course and progression of  condition reviewed. All questions have been addressed.  She received an after visit summary, with information reviewed, and questions answered.      Where appropriate, she is instructed to call the clinic if she has not been notified either by phone or through MyChart with the results of her tests or with an appointment plan for any referrals within 1 week(s). The patient  is to call if her condition worsens or fails to improve or if significant side effects are experienced.       Flonnie Hailstone, APRN - NP

## 2023-01-27 ENCOUNTER — Encounter

## 2023-08-09 ENCOUNTER — Encounter

## 2023-08-09 MED ORDER — LOSARTAN POTASSIUM 25 MG PO TABS
25 | ORAL_TABLET | Freq: Every day | ORAL | 1 refills | Status: DC
Start: 2023-08-09 — End: 2024-02-13

## 2024-02-12 ENCOUNTER — Encounter

## 2024-02-13 MED ORDER — LOSARTAN POTASSIUM 25 MG PO TABS
25 | ORAL_TABLET | Freq: Every day | ORAL | 1 refills | 90.00000 days | Status: AC
Start: 2024-02-13 — End: ?
